# Patient Record
Sex: Female | Born: 1985 | Race: White | Hispanic: No | Marital: Single | Smoking: Current every day smoker
Health system: Southern US, Community
[De-identification: ages and names within clinical notes are randomized; demographics above are authoritative.]

## PROBLEM LIST (undated history)

## (undated) DIAGNOSIS — F1910 Other psychoactive substance abuse, uncomplicated: Secondary | ICD-10-CM

## (undated) HISTORY — PX: CHOLECYSTECTOMY: SHX55

## (undated) HISTORY — PX: APPENDECTOMY: SHX54

---

## 2017-12-05 ENCOUNTER — Other Ambulatory Visit: Payer: Self-pay

## 2017-12-05 ENCOUNTER — Encounter (HOSPITAL_COMMUNITY): Payer: Self-pay | Admitting: *Deleted

## 2017-12-05 DIAGNOSIS — F14929 Cocaine use, unspecified with intoxication, unspecified: Secondary | ICD-10-CM | POA: Insufficient documentation

## 2017-12-05 DIAGNOSIS — Z5321 Procedure and treatment not carried out due to patient leaving prior to being seen by health care provider: Secondary | ICD-10-CM | POA: Insufficient documentation

## 2017-12-05 DIAGNOSIS — R079 Chest pain, unspecified: Secondary | ICD-10-CM | POA: Insufficient documentation

## 2017-12-05 NOTE — ED Triage Notes (Signed)
Chest pain, states she used cocaine today

## 2017-12-06 ENCOUNTER — Emergency Department (HOSPITAL_COMMUNITY)
Admission: EM | Admit: 2017-12-06 | Discharge: 2017-12-06 | Disposition: A | Discharge status: Home / Self Care | Payer: Self-pay | Attending: Emergency Medicine | Admitting: Emergency Medicine

## 2017-12-06 HISTORY — DX: Other psychoactive substance abuse, uncomplicated: F19.10

## 2017-12-06 NOTE — ED Notes (Signed)
Pt not in waiting area x 2 

## 2017-12-16 ENCOUNTER — Encounter (HOSPITAL_COMMUNITY): Payer: Self-pay

## 2017-12-16 ENCOUNTER — Emergency Department (HOSPITAL_COMMUNITY): Payer: Self-pay

## 2017-12-16 ENCOUNTER — Emergency Department (HOSPITAL_COMMUNITY)
Admission: EM | Admit: 2017-12-16 | Discharge: 2017-12-16 | Disposition: A | Discharge status: Home / Self Care | Payer: Self-pay | Attending: Emergency Medicine | Admitting: Emergency Medicine

## 2017-12-16 ENCOUNTER — Other Ambulatory Visit: Payer: Self-pay

## 2017-12-16 DIAGNOSIS — F419 Anxiety disorder, unspecified: Secondary | ICD-10-CM | POA: Insufficient documentation

## 2017-12-16 DIAGNOSIS — F1290 Cannabis use, unspecified, uncomplicated: Secondary | ICD-10-CM | POA: Insufficient documentation

## 2017-12-16 DIAGNOSIS — F1490 Cocaine use, unspecified, uncomplicated: Secondary | ICD-10-CM | POA: Insufficient documentation

## 2017-12-16 DIAGNOSIS — R0789 Other chest pain: Secondary | ICD-10-CM | POA: Insufficient documentation

## 2017-12-16 DIAGNOSIS — R000 Tachycardia, unspecified: Secondary | ICD-10-CM | POA: Insufficient documentation

## 2017-12-16 DIAGNOSIS — F17200 Nicotine dependence, unspecified, uncomplicated: Secondary | ICD-10-CM | POA: Insufficient documentation

## 2017-12-16 DIAGNOSIS — F1190 Opioid use, unspecified, uncomplicated: Secondary | ICD-10-CM | POA: Insufficient documentation

## 2017-12-16 DIAGNOSIS — F1910 Other psychoactive substance abuse, uncomplicated: Secondary | ICD-10-CM | POA: Insufficient documentation

## 2017-12-16 LAB — URINALYSIS, ROUTINE W REFLEX MICROSCOPIC
Bilirubin Urine: NEGATIVE
GLUCOSE, UA: NEGATIVE mg/dL
HGB URINE DIPSTICK: NEGATIVE
Ketones, ur: 20 mg/dL — AB
Nitrite: NEGATIVE
Protein, ur: NEGATIVE mg/dL
SPECIFIC GRAVITY, URINE: 1.008 (ref 1.005–1.030)
pH: 6 (ref 5.0–8.0)

## 2017-12-16 LAB — BASIC METABOLIC PANEL
Anion gap: 12 (ref 5–15)
BUN: 14 mg/dL (ref 6–20)
CALCIUM: 10.1 mg/dL (ref 8.9–10.3)
CO2: 24 mmol/L (ref 22–32)
Chloride: 102 mmol/L (ref 98–111)
Creatinine, Ser: 0.61 mg/dL (ref 0.44–1.00)
GFR calc Af Amer: >60 mL/min (ref >60)
Glucose, Bld: 99 mg/dL (ref 70–99)
Potassium: 3.6 mmol/L (ref 3.5–5.1)
SODIUM: 138 mmol/L (ref 135–145)

## 2017-12-16 LAB — RAPID URINE DRUG SCREEN, HOSP PERFORMED
Amphetamines: POSITIVE — AB
Barbiturates: NOT DETECTED
Benzodiazepines: NOT DETECTED
Cocaine: POSITIVE — AB
OPIATES: POSITIVE — AB
TETRAHYDROCANNABINOL: POSITIVE — AB

## 2017-12-16 LAB — CBC
HCT: 41.7 % (ref 36.0–46.0)
Hemoglobin: 13.5 g/dL (ref 12.0–15.0)
MCH: 26.7 pg (ref 26.0–34.0)
MCHC: 32.4 g/dL (ref 30.0–36.0)
MCV: 82.4 fL (ref 80.0–100.0)
PLATELETS: 344 10*3/uL (ref 150–400)
RBC: 5.06 MIL/uL (ref 3.87–5.11)
RDW: 13.5 % (ref 11.5–15.5)
WBC: 9.5 10*3/uL (ref 4.0–10.5)
nRBC: 0 % (ref 0.0–0.2)

## 2017-12-16 LAB — I-STAT BETA HCG BLOOD, ED (MC, WL, AP ONLY)

## 2017-12-16 LAB — TROPONIN I: Troponin I: <0.03 ng/mL (ref <0.03)

## 2017-12-16 MED ORDER — KETOROLAC TROMETHAMINE 30 MG/ML IJ SOLN
30.0000 mg | Freq: Once | INTRAMUSCULAR | Status: AC
  Administered 2017-12-16: 30 mg via INTRAVENOUS
  Filled 2017-12-16: qty 1

## 2017-12-16 MED ORDER — SODIUM CHLORIDE 0.9 % IV BOLUS
1000.0000 mL | Freq: Once | INTRAVENOUS | Status: AC
  Administered 2017-12-16: 1000 mL via INTRAVENOUS

## 2017-12-16 NOTE — ED Triage Notes (Signed)
Pt presents to ED today with complaints of SOB which started at 2000. Pt also c/o squeezing chest pain when she breaths. Pt denies drug use today, states last time she used heroin and cocaine was 4 days ago.

## 2017-12-16 NOTE — ED Provider Notes (Signed)
[facility] EMERGENCY DEPARTMENT Provider Note   CSN: [id number] Arrival date & time: 12/16/17  2037     History   Chief Complaint Chief Complaint  Patient presents with  . Shortness of Breath  . Chest Pain    HPI Lori Baird is a 32 y.o. female.  Pt presents to the ED today with cp and sob.  Sx started around 2000.  The pt said she has cp when she breathes.  She did have a little pain when she used heroin and cocaine about 4 days ago.  She denies f/c.  No cough.     Past Medical History:  Diagnosis Date  . Drug abuse (HCC)     There are no active problems to display for this patient.   Past Surgical History:  Procedure Laterality Date  . APPENDECTOMY    . CHOLECYSTECTOMY       OB History   None      Home Medications    Prior to Admission medications   Not on File    Family History No family history on file.  Social History Social History   Tobacco Use  . Smoking status: Current Every Day Smoker    Packs/day: 0.50  . Smokeless tobacco: Never Used  Substance Use Topics  . Alcohol use: Not Currently    Frequency: Never  . Drug use: Yes    Types: Cocaine     Allergies   Patient has no known allergies.   Review of Systems Review of Systems  Respiratory: Positive for shortness of breath.   Cardiovascular: Positive for chest pain.  All other systems reviewed and are negative.    Physical Exam Updated Vital Signs BP 117/80   Pulse 81   Temp 98.2 F (36.8 C)   Resp 14   Ht 5\' 1"  (1.549 m)   Wt 49.9 kg   LMP 12/01/2017 (Exact Date) Comment: neg hcg.  SpO2 91%   BMI 20.78 kg/m   Physical Exam  Constitutional: She is oriented to person, place, and time. She appears well-developed and well-nourished.  HENT:  Head: Normocephalic and atraumatic.  Mouth/Throat: Oropharynx is clear and moist.  Eyes: Pupils are equal, round, and reactive to light. EOM are normal.  Neck: Normal range of motion. Neck supple.  Cardiovascular: Regular  rhythm. Tachycardia present.  Pulmonary/Chest: Effort normal and breath sounds normal.  Abdominal: Soft. Bowel sounds are normal.  Musculoskeletal: Normal range of motion.       Right lower leg: Normal.       Left lower leg: Normal.  Neurological: She is alert and oriented to person, place, and time.  Skin: Skin is warm and dry. Capillary refill takes less than 2 seconds.  Psychiatric: Her behavior is normal. Her mood appears anxious.  Nursing note and vitals reviewed.    ED Treatments / Results  Labs (all labs ordered are listed, but only abnormal results are displayed) Labs Reviewed  URINALYSIS, ROUTINE W REFLEX MICROSCOPIC - Abnormal; Notable for the following components:      Result Value   APPearance HAZY (*)    Ketones, ur 20 (*)    Leukocytes, UA MODERATE (*)    Bacteria, UA RARE (*)    All other components within normal limits  RAPID URINE DRUG SCREEN, HOSP PERFORMED - Abnormal; Notable for the following components:   Opiates POSITIVE (*)    Cocaine POSITIVE (*)    Amphetamines POSITIVE (*)    Tetrahydrocannabinol POSITIVE (*)    All other  components within normal limits  BASIC METABOLIC PANEL  CBC  TROPONIN I  I-STAT BETA HCG BLOOD, ED ([facility], [facility], [facility] ONLY)    EKG EKG Interpretation  Date/Time:  Friday December 16 2017 20:53:13 EDT Ventricular Rate:  107 PR Interval:    QRS Duration: 91 QT Interval:  327 QTC Calculation: 437 R Axis:   85 Text Interpretation:  Sinus tachycardia No old tracing to compare Confirmed by Jacalyn Lefevre [id number]) on 12/16/2017 9:25:17 PM   Radiology Dg Chest 2 View  Result Date: 12/16/2017 CLINICAL DATA:  Shortness of breath for several hours EXAM: CHEST - 2 VIEW COMPARISON:  None. FINDINGS: The heart size and mediastinal contours are within normal limits. Both lungs are clear. The visualized skeletal structures are unremarkable. IMPRESSION: No active cardiopulmonary disease. Electronically Signed   By: Alcide Clever M.D.   On:  12/16/2017 21:52    Procedures Procedures (including critical care time)  Medications Ordered in ED Medications  ketorolac (TORADOL) 30 MG/ML injection 30 mg (30 mg Intravenous Given 12/16/17 2100)  sodium chloride 0.9 % bolus 1,000 mL ( Intravenous Rate/Dose Verify 12/16/17 2121)     Initial Impression / Assessment and Plan / ED Course  I have reviewed the triage vital signs and the nursing notes.  Pertinent labs & imaging results that were available during my care of the patient were reviewed by me and considered in my medical decision making (see chart for details).    Pt is feeling better after IVFs and toradol.  CP likely due to multiple drugs.  She requests resources to try to quit using drugs.  She knows to return if worse.  Final Clinical Impressions(s) / ED Diagnoses   Final diagnoses:  Atypical chest pain  Polysubstance abuse [facility])    ED Discharge Orders    None       Jacalyn Lefevre, MD 12/16/17 2204

## 2020-05-04 IMAGING — DX DG CHEST 2V
2 series · 2 of 2 positions shown · non-contrast
Comparison: None.

CLINICAL DATA: Shortness of breath for several hours

EXAM:
CHEST - 2 VIEW

[chest pa]
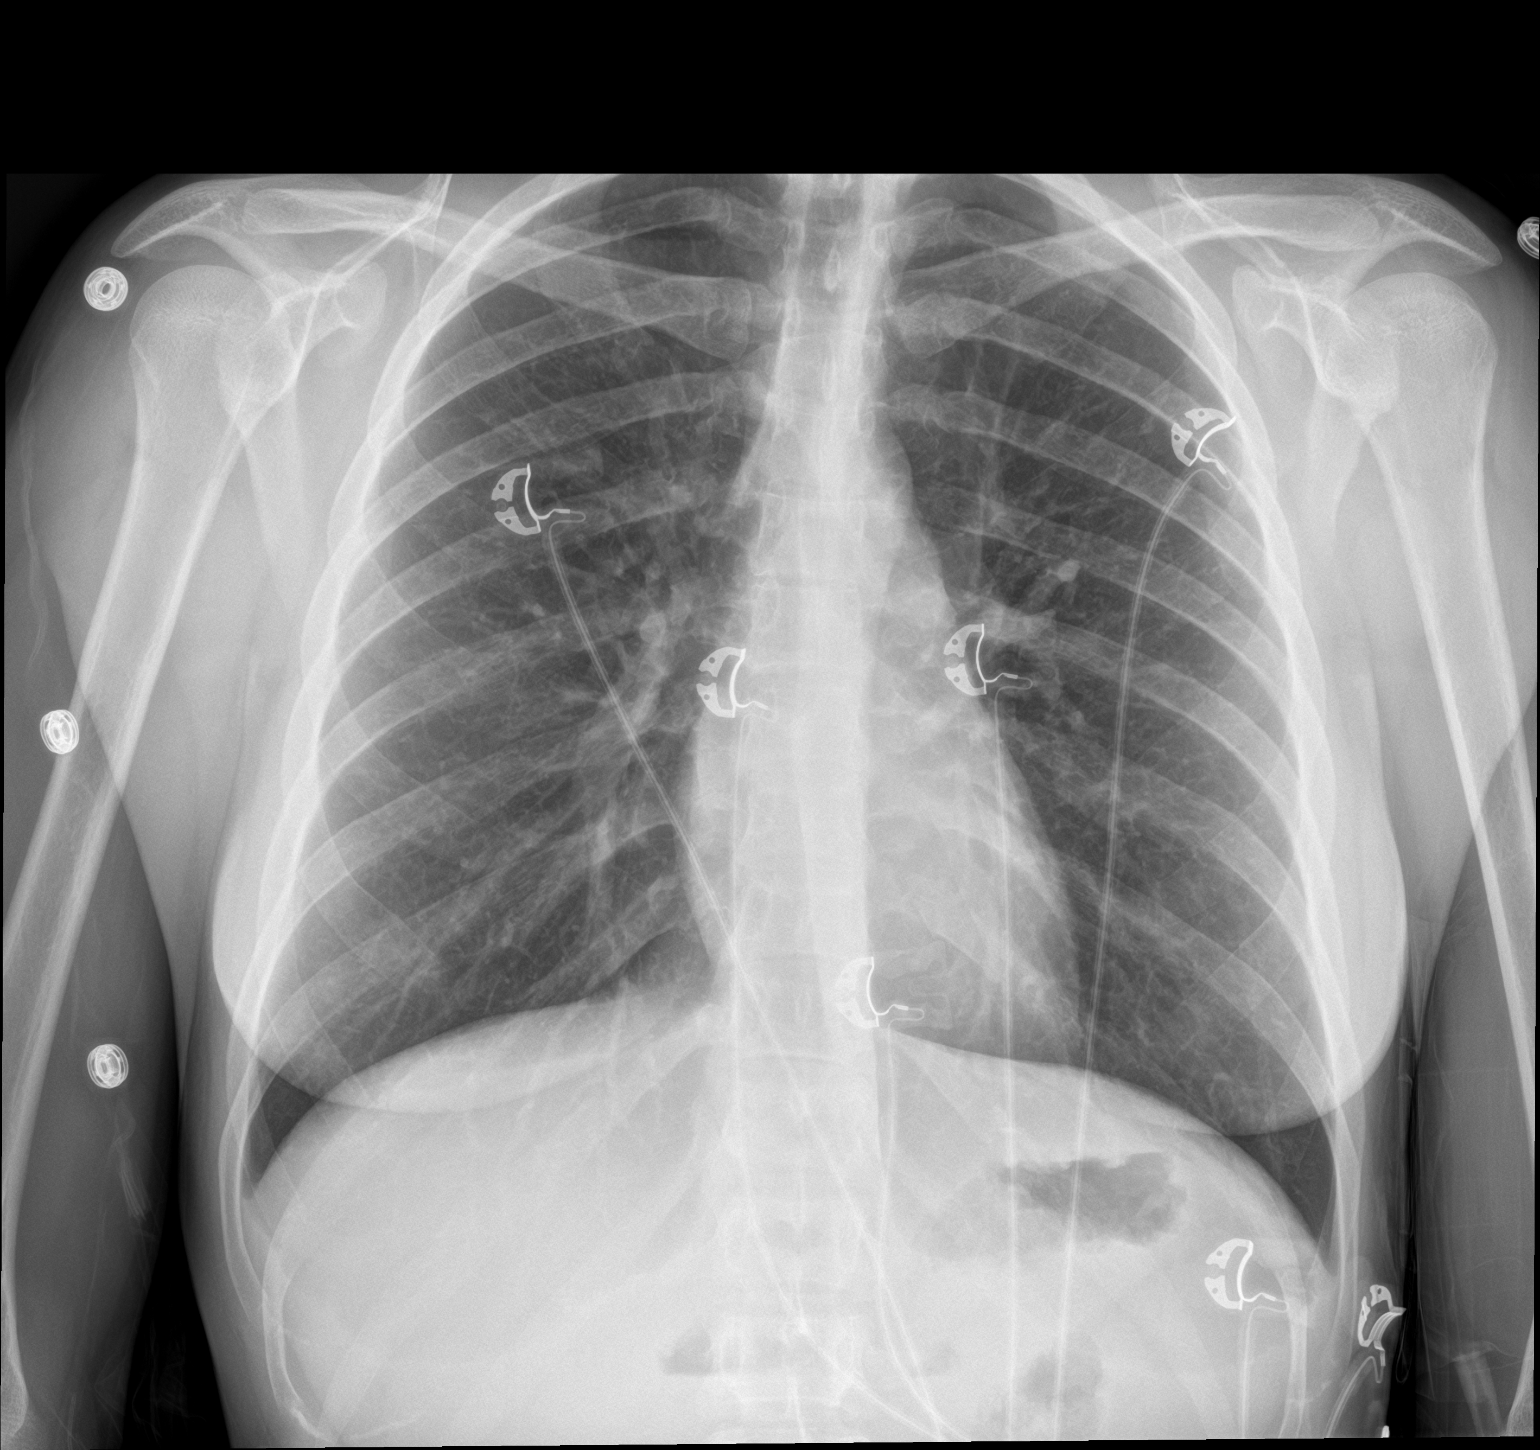

[chest lat]
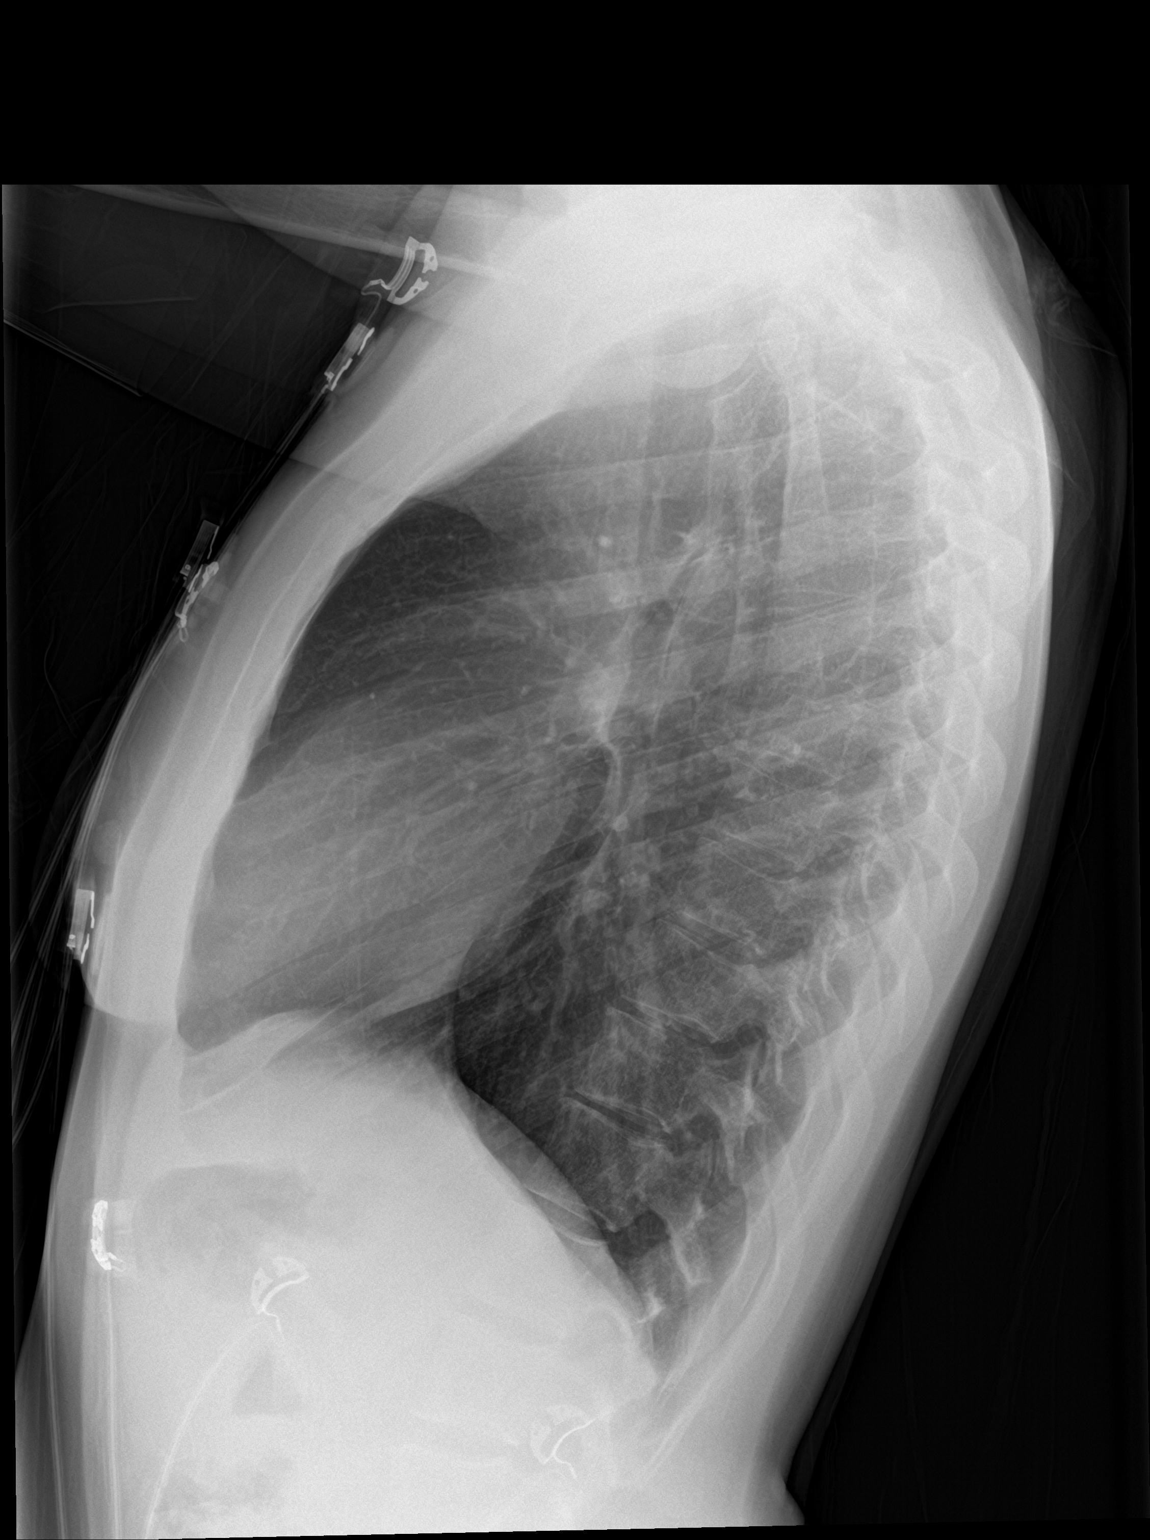

[2 of 2 positions shown; findings below may reference images not displayed]

FINDINGS: The heart size and mediastinal contours are within normal limits.
Both lungs are clear. The visualized skeletal structures are
unremarkable.
IMPRESSION: No active cardiopulmonary disease.
